# Patient Record
Sex: Female | Born: 2000 | Race: Black or African American | Hispanic: No | Marital: Single | Smoking: Never smoker
Health system: Southern US, Community
[De-identification: ages and names within clinical notes are randomized; demographics above are authoritative.]

---

## 2020-08-31 ENCOUNTER — Other Ambulatory Visit: Payer: Self-pay

## 2020-08-31 ENCOUNTER — Inpatient Hospital Stay (HOSPITAL_COMMUNITY)
Admission: AD | Admit: 2020-08-31 | Discharge: 2020-09-01 | Disposition: A | Discharge status: Home / Self Care | Payer: Medicaid Other | Attending: Obstetrics and Gynecology | Admitting: Obstetrics and Gynecology

## 2020-08-31 DIAGNOSIS — R102 Pelvic and perineal pain unspecified side: Secondary | ICD-10-CM

## 2020-08-31 DIAGNOSIS — O3680X0 Pregnancy with inconclusive fetal viability, not applicable or unspecified: Secondary | ICD-10-CM

## 2020-08-31 DIAGNOSIS — R141 Gas pain: Secondary | ICD-10-CM

## 2020-08-31 DIAGNOSIS — Z3A08 8 weeks gestation of pregnancy: Secondary | ICD-10-CM | POA: Insufficient documentation

## 2020-08-31 DIAGNOSIS — O26891 Other specified pregnancy related conditions, first trimester: Secondary | ICD-10-CM | POA: Insufficient documentation

## 2020-09-01 ENCOUNTER — Inpatient Hospital Stay (HOSPITAL_COMMUNITY): Payer: Medicaid Other

## 2020-09-01 ENCOUNTER — Encounter (HOSPITAL_COMMUNITY): Payer: Self-pay

## 2020-09-01 DIAGNOSIS — O99891 Other specified diseases and conditions complicating pregnancy: Secondary | ICD-10-CM

## 2020-09-01 DIAGNOSIS — R109 Unspecified abdominal pain: Secondary | ICD-10-CM | POA: Diagnosis present

## 2020-09-01 DIAGNOSIS — Z3A08 8 weeks gestation of pregnancy: Secondary | ICD-10-CM | POA: Diagnosis not present

## 2020-09-01 DIAGNOSIS — O26891 Other specified pregnancy related conditions, first trimester: Secondary | ICD-10-CM | POA: Diagnosis not present

## 2020-09-01 DIAGNOSIS — R141 Gas pain: Secondary | ICD-10-CM | POA: Diagnosis not present

## 2020-09-01 LAB — WET PREP, GENITAL
Clue Cells Wet Prep HPF POC: NONE SEEN
Sperm: NONE SEEN
Trich, Wet Prep: NONE SEEN
Yeast Wet Prep HPF POC: NONE SEEN

## 2020-09-01 LAB — GC/CHLAMYDIA PROBE AMP (~~LOC~~) NOT AT ARMC
Chlamydia: NEGATIVE
Comment: NEGATIVE
Comment: NORMAL
Neisseria Gonorrhea: NEGATIVE

## 2020-09-01 LAB — URINALYSIS, ROUTINE W REFLEX MICROSCOPIC
Bacteria, UA: NONE SEEN
Bilirubin Urine: NEGATIVE
Glucose, UA: NEGATIVE mg/dL
Hgb urine dipstick: NEGATIVE
Ketones, ur: NEGATIVE mg/dL
Nitrite: NEGATIVE
Protein, ur: NEGATIVE mg/dL
Specific Gravity, Urine: 1.009 (ref 1.005–1.030)
pH: 6 (ref 5.0–8.0)

## 2020-09-01 LAB — CBC
HCT: 34.4 % — ABNORMAL LOW (ref 36.0–46.0)
Hemoglobin: 12.3 g/dL (ref 12.0–15.0)
MCH: 32.3 pg (ref 26.0–34.0)
MCHC: 35.8 g/dL (ref 30.0–36.0)
MCV: 90.3 fL (ref 80.0–100.0)
Platelets: 363 10*3/uL (ref 150–400)
RBC: 3.81 MIL/uL — ABNORMAL LOW (ref 3.87–5.11)
RDW: 12.4 % (ref 11.5–15.5)
WBC: 7.9 10*3/uL (ref 4.0–10.5)
nRBC: 0 % (ref 0.0–0.2)

## 2020-09-01 LAB — ABO/RH: ABO/RH(D): O POS

## 2020-09-01 LAB — POCT PREGNANCY, URINE: Preg Test, Ur: POSITIVE — AB

## 2020-09-01 LAB — HCG, QUANTITATIVE, PREGNANCY: hCG, Beta Chain, Quant, S: 94811 m[IU]/mL — ABNORMAL HIGH (ref <5)

## 2020-09-01 MED ORDER — SIMETHICONE 80 MG PO CHEW: 80.0000 mg | CHEWABLE_TABLET | Freq: Four times a day (QID) | ORAL | 0 refills | Status: AC | PRN

## 2020-09-01 MED ORDER — SIMETHICONE 80 MG PO CHEW: 160.0000 mg | CHEWABLE_TABLET | Freq: Once | ORAL | Status: DC

## 2020-09-01 MED ORDER — SIMETHICONE 80 MG PO CHEW
80.0000 mg | CHEWABLE_TABLET | Freq: Once | ORAL | Status: AC
  Administered 2020-09-01: 01:00:00 80 mg via ORAL
  Filled 2020-09-01: qty 1

## 2020-09-01 NOTE — Discharge Instructions (Signed)
Abdominal Bloating When you have abdominal bloating, your abdomen may feel full, tight, or painful. It may also look bigger than normal or swollen (distended). Common causes of abdominal bloating include:  Swallowing air.  Constipation.  Problems digesting food.  Eating too much.  Irritable bowel syndrome. This is a condition that affects the large intestine.  Lactose intolerance. This is an inability to digest lactose, a natural sugar in dairy products.  Celiac disease. This is a condition that affects the ability to digest gluten, a protein found in some grains.  Gastroparesis. This is a condition that slows down the movement of food in the stomach and small intestine. It is more common in people with diabetes mellitus.  Gastroesophageal reflux disease (GERD). This is a digestive condition that makes stomach acid flow back into the esophagus.  Urinary retention. This means that the body is holding onto urine, and the bladder cannot be emptied all the way. Follow these instructions at home: Eating and drinking  Avoid eating too much.  Try not to swallow air while talking or eating.  Avoid eating while lying down.  Avoid these foods and drinks: ? Foods that cause gas, such as broccoli, cabbage, cauliflower, and baked beans. ? Carbonated drinks. ? Hard candy. ? Chewing gum. Medicines  Take over-the-counter and prescription medicines only as told by your health care provider.  Take probiotic medicines. These medicines contain live bacteria or yeasts that can help digestion.  Take coated peppermint oil capsules. Activity  Try to exercise regularly. Exercise may help to relieve bloating that is caused by gas and relieve constipation. General instructions  Keep all follow-up visits as told by your health care provider. This is important. Contact a health care provider if:  You have nausea and vomiting.  You have diarrhea.  You have abdominal pain.  You have unusual  weight loss or weight gain.  You have severe pain, and medicines do not help. Get help right away if:  You have severe chest pain.  You have trouble breathing.  You have shortness of breath.  You have trouble urinating.  You have darker urine than normal.  You have blood in your stools or have dark, tarry stools. Summary  Abdominal bloating means that the abdomen is swollen.  Common causes of abdominal bloating are swallowing air, constipation, and problems digesting food.  Avoid eating too much and avoid swallowing air.  Avoid foods that cause gas, carbonated drinks, hard candy, and chewing gum. This information is not intended to replace advice given to you by your health care provider. Make sure you discuss any questions you have with your health care provider. Document Revised: 12/23/2018 Document Reviewed: 10/06/2016 Elsevier Patient Education  2020 Elsevier [employer].   [place] [facility] Providers    [facility] [employer] [facility] at [facility]       Phone: [phone]  [facility] [employer] [facility] [place]   Phone: [phone]  [facility] [employer] at [place]  Phone: [phone]  [facility] at [facility]  Phone: [phone]  [facility] for [facility] at [place]  Phone: [phone]  [facility] at [facility]   Phone: [phone]  [facility]       Phone: [phone]  [facility]    Phone: [phone]   [facility]    Phone: [phone]  [facility]    Phone: [phone]  [facility]    Phone: [phone]  [facility]       Phone: [phone]   [facility]  Phone: [phone]  [facility]    Phone: [phone]  [facility]   Phone: [phone]  [facility]      Phone: [phone]  [facility]    Phone: [phone]

## 2020-09-01 NOTE — MAU Provider Note (Signed)
Chief Complaint: Abdominal Pain   Event Date/Time   First Provider Initiated Contact with Patient 09/01/20 0056        SUBJECTIVE HPI: Phyllis Carroll is a 19 y.o. G1P0 at [redacted]w[redacted]d by LMP who presents to maternity admissions reporting intermittent cramping and single area of sharp stabbing pain near waistline.  Sharp pain was sudden tonight, but lower cramping has been ongoing. . She denies vaginal bleeding, vaginal itching/burning, urinary symptoms, h/a, dizziness, n/v, or fever/chills.    Abdominal Pain This is a recurrent problem. The current episode started today. The onset quality is sudden. The problem occurs intermittently. The problem has been unchanged. The pain is located in the generalized abdominal region. The quality of the pain is sharp and cramping. Pertinent negatives include no constipation, diarrhea, dysuria, fever, frequency, headaches, myalgias, nausea or vomiting. Nothing aggravates the pain. The pain is relieved by nothing. She has tried nothing for the symptoms.   RN Note: PT SAYS LMP WAS 10-18-- DID HPT  ON 11-5- POSITIVE . SHE WENT HD- POSITIVE- UPT  2 WEEKS  AGO. Marland Kitchen   SAYS HAS SHARP STABBING PAIN ON RIGHT SIDE OF ABD- STARTED AT 8PM. NO VB. Marland Kitchen  LAST SEX- SAT.  History reviewed. No pertinent past medical history. History reviewed. No pertinent surgical history. Social History   Socioeconomic History  . Marital status: Single    Spouse name: Not on file  . Number of children: Not on file  . Years of education: Not on file  . Highest education level: Not on file  Occupational History  . Not on file  Tobacco Use  . Smoking status: Never Smoker  . Smokeless tobacco: Never Used  Vaping Use  . Vaping Use: Never used  Substance and Sexual Activity  . Alcohol use: Never  . Drug use: Never  . Sexual activity: Yes  Other Topics Concern  . Not on file  Social History Narrative  . Not on file   Social Determinants of Health   Financial Resource Strain: Not on file   Food Insecurity: Not on file  Transportation Needs: Not on file  Physical Activity: Not on file  Stress: Not on file  Social Connections: Not on file  Intimate Partner Violence: Not on file   No current facility-administered medications on file prior to encounter.   No current outpatient medications on file prior to encounter.   Not on File  I have reviewed patient's Past Medical Hx, Surgical Hx, Family Hx, Social Hx, medications and allergies.   ROS:  Review of Systems  Constitutional: Negative for fever.  Gastrointestinal: Positive for abdominal pain. Negative for constipation, diarrhea, nausea and vomiting.  Genitourinary: Negative for dysuria and frequency.  Musculoskeletal: Negative for myalgias.  Neurological: Negative for headaches.   Review of Systems  Other systems negative   Physical Exam  Physical Exam Patient Vitals for the past 24 hrs:  BP Temp Temp src Pulse Resp Height Weight  09/01/20 0011 (!) 116/59 98.4 F (36.9 C) Oral 95 20 5' 4.5" (1.638 m) 74 kg   Constitutional: Well-developed, well-nourished female in no acute distress.  Cardiovascular: normal rate Respiratory: normal effort GI: Abd soft, non-tender. Pos BS x 4  Abdomen distended with gas MS: Extremities nontender, no edema, normal ROM Neurologic: Alert and oriented x 4.  GU: Neg CVAT.  PELVIC EXAM:  Bimanual exam: Cervix 0/long/high, firm, anterior, neg CMT, uterus nontender, nonenlarged, adnexa without tenderness, enlargement, or mass  LAB RESULTS Results for orders placed or performed during  the hospital encounter of 08/31/20 (from the past 24 hour(s))  Urinalysis, Routine w reflex microscopic Urine, Clean Catch     Status: Abnormal   Collection Time: 09/01/20 12:16 AM  Result Value Ref Range   Color, Urine YELLOW YELLOW   APPearance CLEAR CLEAR   Specific Gravity, Urine 1.009 1.005 - 1.030   pH 6.0 5.0 - 8.0   Glucose, UA NEGATIVE NEGATIVE mg/dL   Hgb urine dipstick NEGATIVE  NEGATIVE   Bilirubin Urine NEGATIVE NEGATIVE   Ketones, ur NEGATIVE NEGATIVE mg/dL   Protein, ur NEGATIVE NEGATIVE mg/dL   Nitrite NEGATIVE NEGATIVE   Leukocytes,Ua TRACE (A) NEGATIVE   RBC / HPF 0-5 0 - 5 RBC/hpf   WBC, UA 0-5 0 - 5 WBC/hpf   Bacteria, UA NONE SEEN NONE SEEN   Squamous Epithelial / LPF 0-5 0 - 5   Mucus PRESENT   Pregnancy, urine POC     Status: Abnormal   Collection Time: 09/01/20 12:20 AM  Result Value Ref Range   Preg Test, Ur POSITIVE (A) NEGATIVE  Wet prep, genital     Status: Abnormal   Collection Time: 09/01/20  1:00 AM  Result Value Ref Range   Yeast Wet Prep HPF POC NONE SEEN NONE SEEN   Trich, Wet Prep NONE SEEN NONE SEEN   Clue Cells Wet Prep HPF POC NONE SEEN NONE SEEN   WBC, Wet Prep HPF POC MANY (A) NONE SEEN   Sperm NONE SEEN   CBC     Status: Abnormal   Collection Time: 09/01/20  1:12 AM  Result Value Ref Range   WBC 7.9 4.0 - 10.5 K/uL   RBC 3.81 (L) 3.87 - 5.11 MIL/uL   Hemoglobin 12.3 12.0 - 15.0 g/dL   HCT 57.0 (L) 17.7 - 93.9 %   MCV 90.3 80.0 - 100.0 fL   MCH 32.3 26.0 - 34.0 pg   MCHC 35.8 30.0 - 36.0 g/dL   RDW 03.0 09.2 - 33.0 %   Platelets 363 150 - 400 K/uL   nRBC 0.0 0.0 - 0.2 %  hCG, quantitative, pregnancy     Status: Abnormal   Collection Time: 09/01/20  1:12 AM  Result Value Ref Range   hCG, Beta Chain, Quant, S 94,811 (H) <5 mIU/mL  ABO/Rh     Status: None   Collection Time: 09/01/20  1:12 AM  Result Value Ref Range   ABO/RH(D) O POS    No rh immune globuloin      NOT A RH IMMUNE GLOBULIN CANDIDATE, PT RH POSITIVE Performed at [facility], [address] [place], [place] [address]     IMAGING [place] Comp Less 14 Wks  Result Date: 09/01/2020 CLINICAL DATA:  Right pelvic pain.  LMP 04/11/2021 EXAM: OBSTETRIC <14 WK ULTRASOUND TECHNIQUE: Transabdominal ultrasound was performed for evaluation of the gestation as well as the maternal uterus and adnexal regions. COMPARISON:  None. FINDINGS: Intrauterine  gestational sac: Present, single Yolk sac:  Not visualized Embryo:  Present, single Cardiac Activity: Present, regular Heart Rate: 158 bpm MSD: Appropriate given fetal size CRL:   15 mm   7 w 6 d                  [place] EDC: 04/14/2021 Subchorionic hemorrhage:  None visualized. Maternal uterus/adnexae: The uterus is anteverted. No intrauterine masses are seen. The cervix is closed and is unremarkable. No free fluid seen within the pelvis. The maternal right ovary is unremarkable. The left  ovary is not visualized. IMPRESSION: Single living intrauterine gestation with an estimated gestational age of [redacted] weeks, 6 days. Electronically Signed   By: Helyn Numbers MD   On: 09/01/2020 01:46     MAU Management/MDM: Ordered usual first trimester r/o ectopic labs.   Pelvic exam and cultures done Will check baseline Ultrasound to rule out ectopic.  This bleeding/pain can represent a normal pregnancy with bleeding, spontaneous abortion or even an ectopic which can be life-threatening.  The process as listed above helps to determine which of these is present.  Reviewed results Encouraged to start prenatal care, list given Given Simethicone which relieved the sharp colicky pain.   ASSESSMENT Single IUP at [redacted]w[redacted]d by LMP, [redacted]w[redacted]d per US Abdominal gas pain  PLAN Discharge home List of OB providers given  Pt stable at time of discharge. Encouraged to return here if she develops worsening of symptoms, increase in pain, fever, or other concerning symptoms.    Wynelle Bourgeois CNM, MSN Certified Nurse-Midwife 09/01/2020  12:56 AM

## 2020-09-01 NOTE — MAU Note (Signed)
PT SAYS LMP WAS 10-18-- DID HPT  ON 11-5- POSITIVE . SHE WENT HD- POSITIVE- UPT  2 WEEKS  AGO. Marland Kitchen   SAYS HAS SHARP STABBING PAIN ON RIGHT SIDE OF ABD- STARTED AT 8PM. NO VB. Marland Kitchen  LAST SEX- SAT.

## 2022-03-13 IMAGING — US US OB COMP LESS 14 WK
1 series · 15 of 28 positions shown · non-contrast
Comparison: None.

CLINICAL DATA: Right pelvic pain.  LMP 04/11/2021

EXAM:
OBSTETRIC <14 WK ULTRASOUND
TECHNIQUE: Transabdominal ultrasound was performed for evaluation of the
gestation as well as the maternal uterus and adnexal regions.

[Series 1: us ob comp less 14 wk · 15 of 31 slices shown]
[im 1/31]
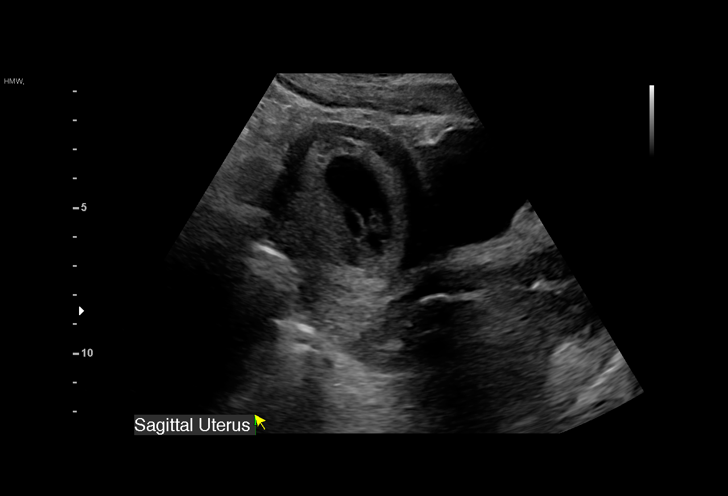
[im 3/31]
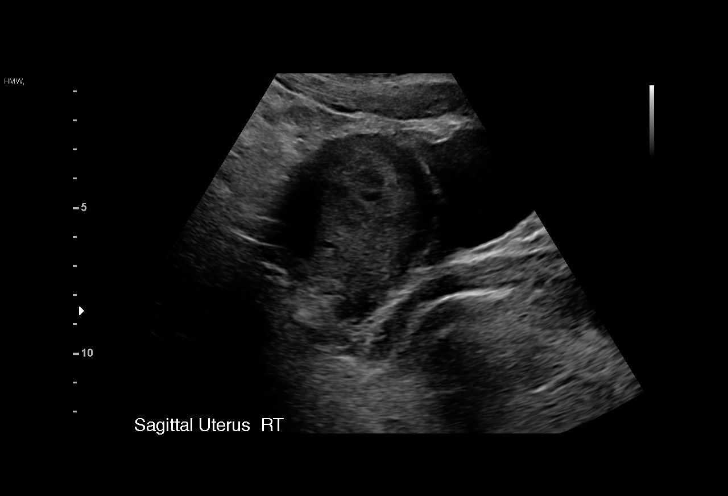
[im 5/31]
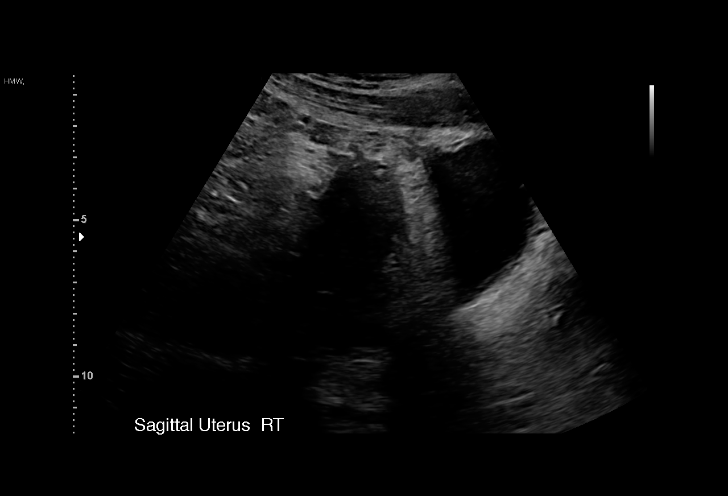
[im 7/31]
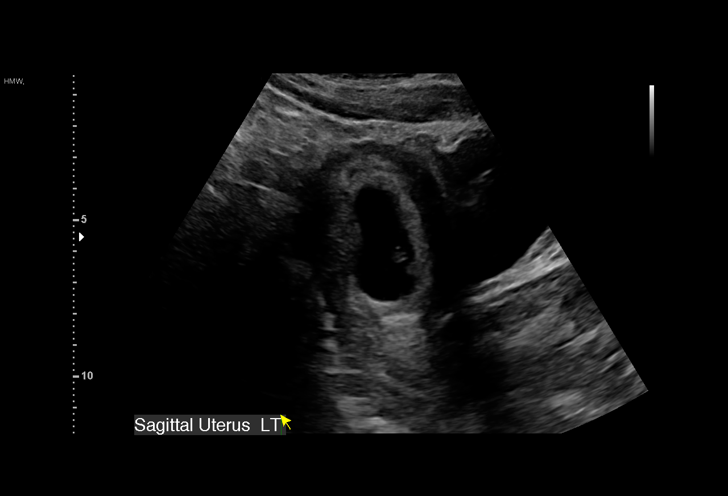
[im 9/31]
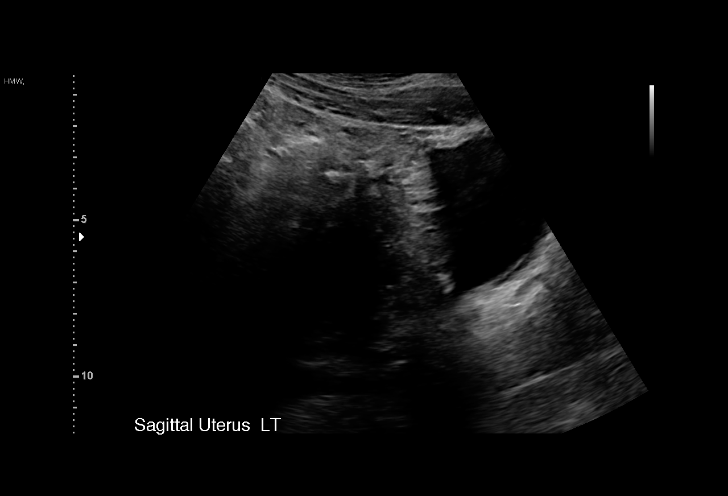
[im 12/31]
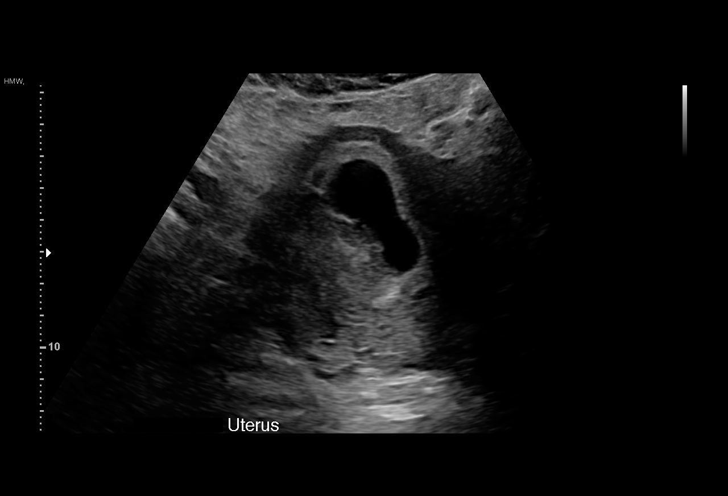
[im 14/31]
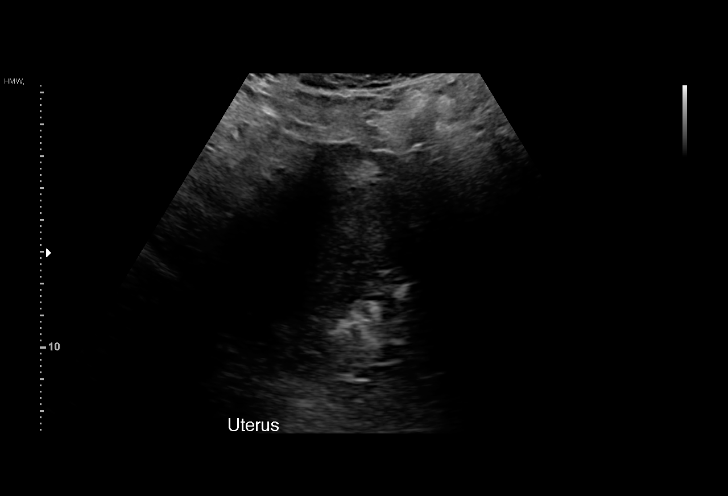
[im 16/31]
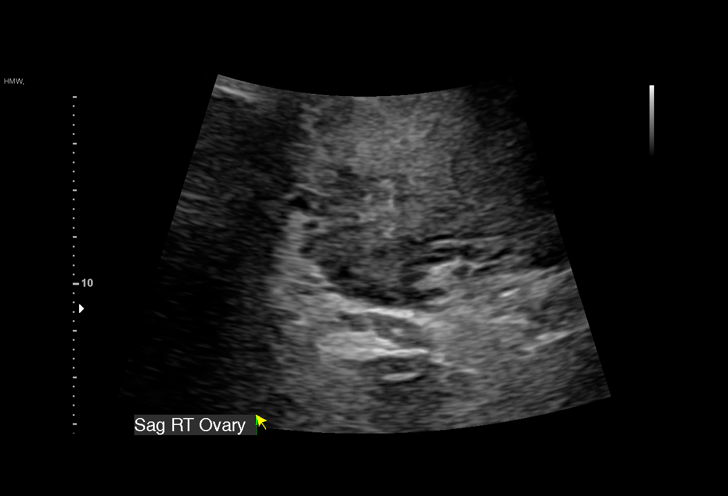
[im 17/31]
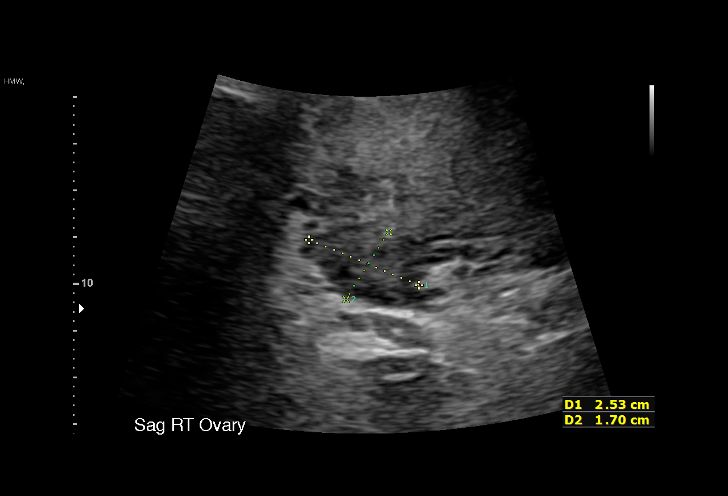
[im 19/31]
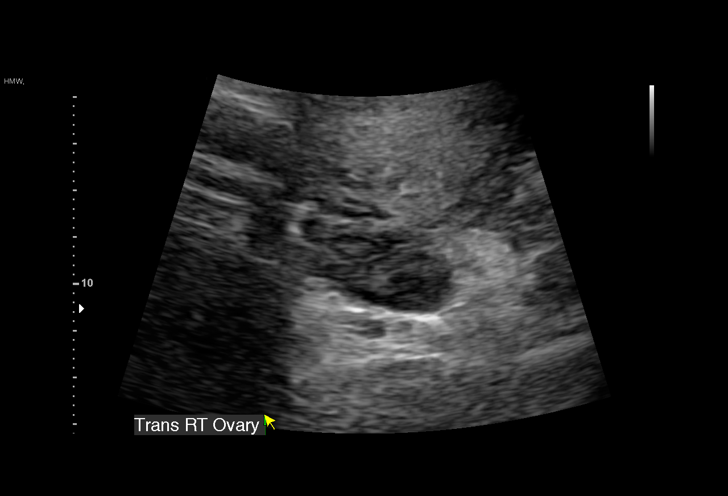
[im 22/31]
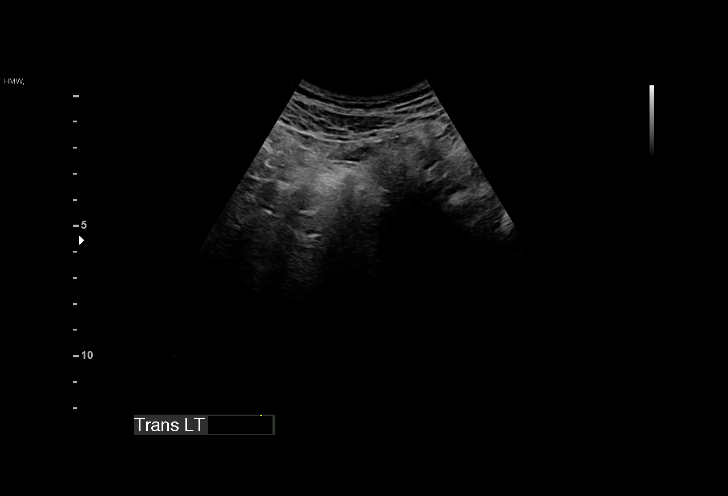
[im 24/31]
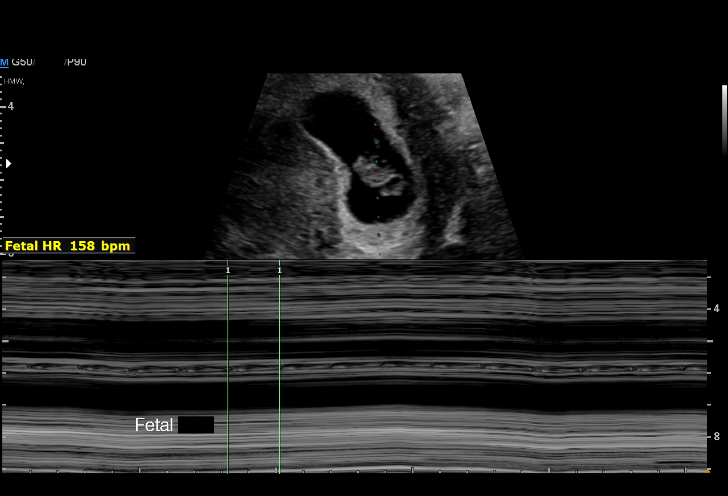
[im 26/31]
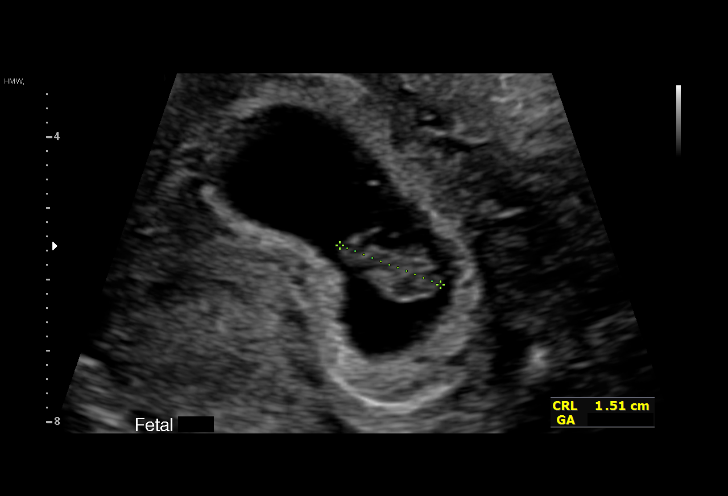
[im 28/31]
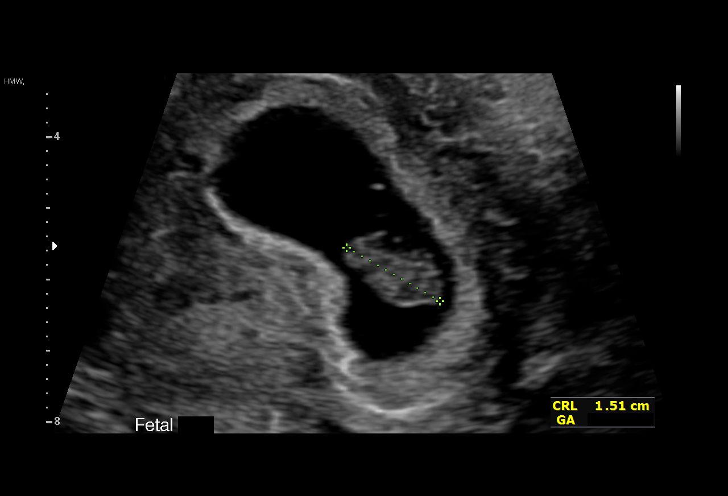
[im 31/31]
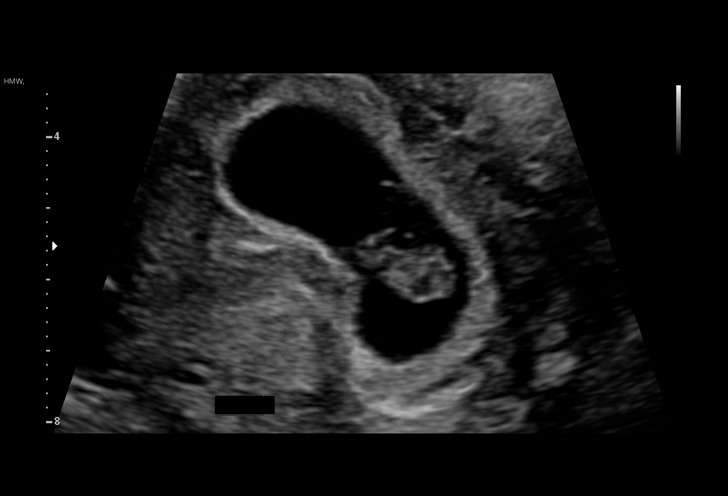

[15 of 28 positions shown; findings below may reference images not displayed]

FINDINGS: Intrauterine gestational sac: Present, single

Yolk sac:  Not visualized

Embryo:  Present, single

Cardiac Activity: Present, regular

Heart Rate: 158 bpm

MSD: Appropriate given fetal size

CRL:   15 mm   7 w 6 d                  US EDC: 04/14/2021

Subchorionic hemorrhage:  None visualized.

Maternal uterus/adnexae: The uterus is anteverted. No intrauterine
masses are seen. The cervix is closed and is unremarkable. No free
fluid seen within the pelvis. The maternal right ovary is
unremarkable. The left ovary is not visualized.
IMPRESSION: Single living intrauterine gestation with an estimated gestational
age of 7 weeks, 6 days.
